# Patient Record
Sex: Male | Born: 1985 | Race: Black or African American | Hispanic: No | Marital: Single | Smoking: Never smoker
Health system: Southern US, Community
[De-identification: ages and names within clinical notes are randomized; demographics above are authoritative.]

---

## 2009-02-25 ENCOUNTER — Emergency Department (HOSPITAL_COMMUNITY): Admission: EM | Admit: 2009-02-25 | Discharge: 2009-02-25 | Payer: Self-pay | Admitting: Emergency Medicine

## 2009-03-31 ENCOUNTER — Emergency Department (HOSPITAL_COMMUNITY): Admission: EM | Admit: 2009-03-31 | Discharge: 2009-03-31 | Payer: Self-pay | Admitting: Emergency Medicine

## 2009-04-02 ENCOUNTER — Emergency Department (HOSPITAL_COMMUNITY): Admission: EM | Admit: 2009-04-02 | Discharge: 2009-04-02 | Payer: Self-pay | Admitting: Emergency Medicine

## 2009-04-03 ENCOUNTER — Emergency Department (HOSPITAL_COMMUNITY): Admission: EM | Admit: 2009-04-03 | Discharge: 2009-04-03 | Payer: Self-pay | Admitting: Emergency Medicine

## 2009-08-07 ENCOUNTER — Encounter: Admission: RE | Admit: 2009-08-07 | Discharge: 2009-08-07 | Payer: Self-pay | Admitting: Internal Medicine

## 2010-09-07 ENCOUNTER — Encounter: Payer: Self-pay | Admitting: Internal Medicine

## 2010-11-21 LAB — GRAM STAIN

## 2010-11-21 LAB — DIFFERENTIAL
Basophils Absolute: 0 10*3/uL (ref 0.0–0.1)
Basophils Relative: 0 % (ref 0–1)
Eosinophils Relative: 0 % (ref 0–5)
Lymphocytes Relative: 12 % (ref 12–46)
Lymphocytes Relative: 5 % — ABNORMAL LOW (ref 12–46)
Lymphs Abs: 0.6 10*3/uL — ABNORMAL LOW (ref 0.7–4.0)
Monocytes Absolute: 0.4 10*3/uL (ref 0.1–1.0)
Neutro Abs: 4.5 10*3/uL (ref 1.7–7.7)
Neutrophils Relative %: 80 % — ABNORMAL HIGH (ref 43–77)

## 2010-11-21 LAB — BASIC METABOLIC PANEL
BUN: 11 mg/dL (ref 6–23)
CO2: 22 mEq/L (ref 19–32)
Calcium: 8.9 mg/dL (ref 8.4–10.5)
Chloride: 104 mEq/L (ref 96–112)
Creatinine, Ser: 0.96 mg/dL (ref 0.4–1.5)

## 2010-11-21 LAB — CSF CULTURE W GRAM STAIN: Culture: NO GROWTH

## 2010-11-21 LAB — CSF CELL COUNT WITH DIFFERENTIAL
RBC Count, CSF: 1 /mm3 — ABNORMAL HIGH
WBC, CSF: 0 /mm3 (ref 0–5)

## 2010-11-21 LAB — URINALYSIS, ROUTINE W REFLEX MICROSCOPIC
Bilirubin Urine: NEGATIVE
Ketones, ur: 15 mg/dL — AB
Nitrite: NEGATIVE
Protein, ur: NEGATIVE mg/dL
pH: 7 (ref 5.0–8.0)

## 2010-11-21 LAB — POCT I-STAT, CHEM 8
BUN: 14 mg/dL (ref 6–23)
Chloride: 106 mEq/L (ref 96–112)
Glucose, Bld: 95 mg/dL (ref 70–99)
HCT: 48 % (ref 39.0–52.0)
Potassium: 4.2 mEq/L (ref 3.5–5.1)

## 2010-11-21 LAB — CBC
HCT: 47.4 % (ref 39.0–52.0)
Hemoglobin: 15.3 g/dL (ref 13.0–17.0)
MCHC: 33.6 g/dL (ref 30.0–36.0)
Platelets: 169 10*3/uL (ref 150–400)
RDW: 13.6 % (ref 11.5–15.5)
WBC: 4.9 10*3/uL (ref 4.0–10.5)

## 2010-11-21 LAB — POCT CARDIAC MARKERS: Troponin i, poc: <0.05 ng/mL (ref 0.00–0.09)

## 2010-11-21 LAB — GLUCOSE, CSF: Glucose, CSF: 63 mg/dL (ref 43–76)

## 2010-11-21 LAB — PROTEIN, CSF: Total  Protein, CSF: 23 mg/dL (ref 15–45)

## 2010-11-22 LAB — DIFFERENTIAL
Basophils Relative: 0 % (ref 0–1)
Eosinophils Absolute: 0 10*3/uL (ref 0.0–0.7)
Eosinophils Relative: 1 % (ref 0–5)
Lymphs Abs: 1.3 10*3/uL (ref 0.7–4.0)
Monocytes Relative: 11 % (ref 3–12)
Neutrophils Relative %: 66 % (ref 43–77)

## 2010-11-22 LAB — URINALYSIS, ROUTINE W REFLEX MICROSCOPIC
Bilirubin Urine: NEGATIVE
Glucose, UA: NEGATIVE mg/dL
Ketones, ur: NEGATIVE mg/dL
Protein, ur: NEGATIVE mg/dL
pH: 8 (ref 5.0–8.0)

## 2010-11-22 LAB — CBC
HCT: 44.6 % (ref 39.0–52.0)
MCHC: 32.6 g/dL (ref 30.0–36.0)
MCV: 82.1 fL (ref 78.0–100.0)
Platelets: 180 10*3/uL (ref 150–400)
RDW: 13.9 % (ref 11.5–15.5)

## 2010-11-22 LAB — COMPREHENSIVE METABOLIC PANEL
ALT: 18 U/L (ref 0–53)
Alkaline Phosphatase: 86 U/L (ref 39–117)
CO2: 27 mEq/L (ref 19–32)
Total Bilirubin: 0.9 mg/dL (ref 0.3–1.2)
Total Protein: 6.6 g/dL (ref 6.0–8.3)

## 2010-11-22 LAB — LIPASE, BLOOD: Lipase: 28 U/L (ref 11–59)

## 2015-08-29 ENCOUNTER — Emergency Department (HOSPITAL_COMMUNITY)
Admission: EM | Admit: 2015-08-29 | Discharge: 2015-08-29 | Disposition: A | Discharge status: Home / Self Care | Payer: No Typology Code available for payment source | Attending: Emergency Medicine | Admitting: Emergency Medicine

## 2015-08-29 ENCOUNTER — Encounter (HOSPITAL_COMMUNITY): Payer: Self-pay | Admitting: *Deleted

## 2015-08-29 ENCOUNTER — Emergency Department (HOSPITAL_COMMUNITY): Payer: No Typology Code available for payment source

## 2015-08-29 DIAGNOSIS — N201 Calculus of ureter: Secondary | ICD-10-CM | POA: Diagnosis not present

## 2015-08-29 DIAGNOSIS — R109 Unspecified abdominal pain: Secondary | ICD-10-CM | POA: Diagnosis present

## 2015-08-29 LAB — URINALYSIS, ROUTINE W REFLEX MICROSCOPIC
Bilirubin Urine: NEGATIVE
Glucose, UA: NEGATIVE mg/dL
Ketones, ur: NEGATIVE mg/dL
Leukocytes, UA: NEGATIVE
Nitrite: NEGATIVE
Protein, ur: NEGATIVE mg/dL
SPECIFIC GRAVITY, URINE: 1.011 (ref 1.005–1.030)
pH: 7.5 (ref 5.0–8.0)

## 2015-08-29 LAB — COMPREHENSIVE METABOLIC PANEL
ALBUMIN: 4.1 g/dL (ref 3.5–5.0)
ALT: 27 U/L (ref 17–63)
AST: 29 U/L (ref 15–41)
Alkaline Phosphatase: 69 U/L (ref 38–126)
Anion gap: 11 (ref 5–15)
BUN: 11 mg/dL (ref 6–20)
CHLORIDE: 108 mmol/L (ref 101–111)
CO2: 23 mmol/L (ref 22–32)
Calcium: 9 mg/dL (ref 8.9–10.3)
Creatinine, Ser: 1.2 mg/dL (ref 0.61–1.24)
GFR calc Af Amer: >60 mL/min (ref >60)
GLUCOSE: 147 mg/dL — AB (ref 65–99)
POTASSIUM: 3.5 mmol/L (ref 3.5–5.1)
Sodium: 142 mmol/L (ref 135–145)
Total Bilirubin: 1.8 mg/dL — ABNORMAL HIGH (ref 0.3–1.2)
Total Protein: 7.6 g/dL (ref 6.5–8.1)

## 2015-08-29 LAB — CBC WITH DIFFERENTIAL/PLATELET
Basophils Absolute: 0 10*3/uL (ref 0.0–0.1)
Basophils Relative: 0 %
EOS PCT: 0 %
Eosinophils Absolute: 0 10*3/uL (ref 0.0–0.7)
HCT: 42 % (ref 39.0–52.0)
Hemoglobin: 13.8 g/dL (ref 13.0–17.0)
LYMPHS ABS: 1 10*3/uL (ref 0.7–4.0)
LYMPHS PCT: 14 %
MCH: 26 pg (ref 26.0–34.0)
MCHC: 32.9 g/dL (ref 30.0–36.0)
MCV: 79.1 fL (ref 78.0–100.0)
MONOS PCT: 7 %
Monocytes Absolute: 0.5 10*3/uL (ref 0.1–1.0)
NEUTROS PCT: 79 %
Neutro Abs: 5.9 10*3/uL (ref 1.7–7.7)
PLATELETS: 207 10*3/uL (ref 150–400)
RBC: 5.31 MIL/uL (ref 4.22–5.81)
RDW: 13.1 % (ref 11.5–15.5)
WBC: 7.5 10*3/uL (ref 4.0–10.5)

## 2015-08-29 LAB — URINE MICROSCOPIC-ADD ON
BACTERIA UA: NONE SEEN
SQUAMOUS EPITHELIAL / LPF: NONE SEEN
WBC, UA: NONE SEEN WBC/hpf (ref 0–5)

## 2015-08-29 LAB — LIPASE, BLOOD: LIPASE: 25 U/L (ref 11–51)

## 2015-08-29 MED ORDER — IBUPROFEN 600 MG PO TABS: 600.0000 mg | ORAL_TABLET | Freq: Three times a day (TID) | ORAL | Status: DC | PRN

## 2015-08-29 MED ORDER — OXYCODONE-ACETAMINOPHEN 5-325 MG PO TABS: 1.0000 | ORAL_TABLET | ORAL | Status: DC | PRN

## 2015-08-29 MED ORDER — TAMSULOSIN HCL 0.4 MG PO CAPS: 0.4000 mg | ORAL_CAPSULE | Freq: Every day | ORAL | Status: DC

## 2015-08-29 MED ORDER — ONDANSETRON HCL 4 MG/2ML IJ SOLN
4.0000 mg | Freq: Once | INTRAMUSCULAR | Status: AC
  Administered 2015-08-29: 4 mg via INTRAVENOUS
  Filled 2015-08-29: qty 2

## 2015-08-29 MED ORDER — ONDANSETRON 4 MG PO TBDP: ORAL_TABLET | ORAL | Status: DC

## 2015-08-29 MED ORDER — SODIUM CHLORIDE 0.9 % IV BOLUS (SEPSIS)
1000.0000 mL | Freq: Once | INTRAVENOUS | Status: AC
  Administered 2015-08-29: 1000 mL via INTRAVENOUS

## 2015-08-29 MED ORDER — MORPHINE SULFATE (PF) 4 MG/ML IV SOLN
4.0000 mg | Freq: Once | INTRAVENOUS | Status: AC
  Administered 2015-08-29: 4 mg via INTRAVENOUS
  Filled 2015-08-29: qty 1

## 2015-08-29 NOTE — ED Notes (Signed)
Bed: [id number] Expected date:  Expected time:  Means of arrival:  Comments: EMS- 29yo M, abdominal pain

## 2015-08-29 NOTE — ED Provider Notes (Signed)
CSN: [id number]     Arrival date & time 08/29/15  4098 History   First MD Initiated Contact with Patient 08/29/15 1010     Chief Complaint  Patient presents with  . Abdominal Pain  . Nausea  . Emesis     (Consider location/radiation/quality/duration/timing/severity/associated sxs/prior Treatment) HPI  30 year old male presents with acute abdominal pain and vomiting. Patient states that immediately after eating to oranges morning while he is getting ready for school he developed left-sided abdominal pain and vomited. Given Zofran and fentanyl by EMS. Patient denies any diarrhea. No fevers but has felt chills since this started. Denies any hematemesis. No dysuria or hematuria. Patient rates his pain as severe. No similar symptoms in the past. No back pain.  History reviewed. No pertinent past medical history. History reviewed. No pertinent past surgical history. History reviewed. No pertinent family history. Social History  Substance Use Topics  . Smoking status: Never Smoker   . Smokeless tobacco: None  . Alcohol Use: No    Review of Systems  Constitutional: Positive for chills. Negative for fever.  Gastrointestinal: Positive for nausea, vomiting and abdominal pain. Negative for diarrhea.  Genitourinary: Negative for dysuria and hematuria.  Musculoskeletal: Negative for back pain.  All other systems reviewed and are negative.     Allergies  Review of patient's allergies indicates no known allergies.  Home Medications   Prior to Admission medications   Not on File   BP 131/103 mmHg  Pulse 66  Temp(Src) 97.8 F (36.6 C) (Axillary)  Resp 20  SpO2 100% Physical Exam  Constitutional: He is oriented to person, place, and time. He appears well-developed and well-nourished.  HENT:  Head: Normocephalic and atraumatic.  Right Ear: External ear normal.  Left Ear: External ear normal.  Nose: Nose normal.  Eyes: Right eye exhibits no discharge. Left eye exhibits no  discharge.  Neck: Neck supple.  Cardiovascular: Normal rate, regular rhythm, normal heart sounds and intact distal pulses.   Pulmonary/Chest: Effort normal and breath sounds normal.  Abdominal: Soft. There is tenderness (mild) in the left upper quadrant and left lower quadrant. There is no CVA tenderness.  Musculoskeletal: He exhibits no edema.  Neurological: He is alert and oriented to person, place, and time.  Skin: Skin is warm and dry.  Nursing note and vitals reviewed.   ED Course  Procedures (including critical care time) Labs Review Labs Reviewed  COMPREHENSIVE METABOLIC PANEL - Abnormal; Notable for the following:    Glucose, Bld 147 (*)    Total Bilirubin 1.8 (*)    All other components within normal limits  URINALYSIS, ROUTINE W REFLEX MICROSCOPIC (NOT AT [facility]) - Abnormal; Notable for the following:    Hgb urine dipstick LARGE (*)    All other components within normal limits  CBC WITH DIFFERENTIAL/PLATELET  LIPASE, BLOOD  URINE MICROSCOPIC-ADD ON    Imaging Review Ct Renal Stone Study  08/29/2015  CLINICAL DATA:  Left side pain starting this morning EXAM: CT ABDOMEN AND PELVIS WITHOUT CONTRAST TECHNIQUE: Multidetector CT imaging of the abdomen and pelvis was performed following the standard protocol without IV contrast. COMPARISON:  None. FINDINGS: Lung bases are unremarkable. Sagittal images of the spine are unremarkable. Unenhanced liver shows no biliary ductal dilatation. No calcified gallstones are noted within gallbladder. There is no aortic aneurysm. Unenhanced pancreas, spleen and adrenal glands are unremarkable. No nephrolithiasis. Mild left hydronephrosis and left hydroureter. No small bowel obstruction.  No ascites or free air.  No adenopathy. Normal appendix is  noted in axial image 42. No pericecal inflammation. The terminal ileum is unremarkable. Axial image 65 there is 3.5 mm calcified obstructive calculus in distal left ureter about 6.5 mm from left UVJ. No  calcified calculi are noted within urinary bladder. Prostate gland and seminal vesicles are unremarkable. No inguinal adenopathy. IMPRESSION: 1. There is mild left hydronephrosis and left hydroureter. No nephrolithiasis. 2. Axial image 42 there is 3.5 mm calcified obstructive calculus in distal left ureter about 6.5 mm from left UVJ. 3. No calcified calculi are noted within urinary bladder. 4. Normal appendix.  No pericecal inflammation. 5. No small bowel obstruction. Electronically Signed   By: Natasha MeadLiviu  Pop M.D.   On: 08/29/2015 11:23   I have personally reviewed and evaluated these images and lab results as part of my medical decision-making.   EKG Interpretation None      MDM   Final diagnoses:  Left ureteral stone    CT shows a 3.5 mm left ureteral calculus that is likely the cause of his acute pain and vomiting. Now has minimal pain and no further nausea. No UTI. Feels well enough to go home, encouraged ibuprofen and then percocet as needed along with short course of flomax. Advised of return precautions, recommend f/u with urology.    Pricilla LovelessScott Neesha Langton, MD 08/29/15 1600

## 2015-08-29 NOTE — ED Notes (Signed)
Pt woke feeling fine. Ate ate two oranges this morning and immediately started feeling 10/10 abdominal pain with vomiting. Patient received 4 mg IV zofran and 15 mcg IV fentanyl en route by EMS. Patient arrived with 18 gauge PIV and NS infusing. Patient currently writhing around in bed in pain. Patient vomited approx 200 CC clear fluid in emesis bag.

## 2015-08-29 NOTE — ED Notes (Signed)
RN to draw from IV access for labs

## 2017-02-05 IMAGING — CT CT RENAL STONE PROTOCOL
2 of 3 series · 16 of 29 positions shown, 18 images · non-contrast
Comparison: None.

CLINICAL DATA: Left side pain starting this morning

EXAM:
CT ABDOMEN AND PELVIS WITHOUT CONTRAST
TECHNIQUE: Multidetector CT imaging of the abdomen and pelvis was performed
following the standard protocol without IV contrast.

[Series 3: coronal · coronal · 0.75mm/px · 3 of 67 slices shown]
[im 23/67  soft-tissue]
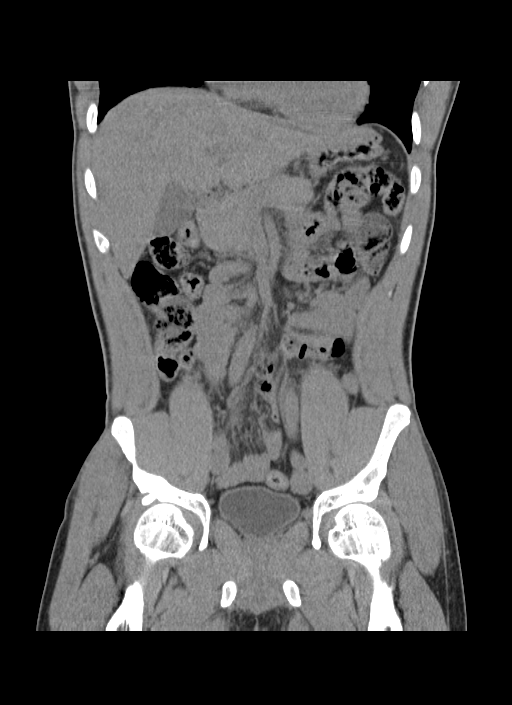
[im 30/67  soft-tissue]
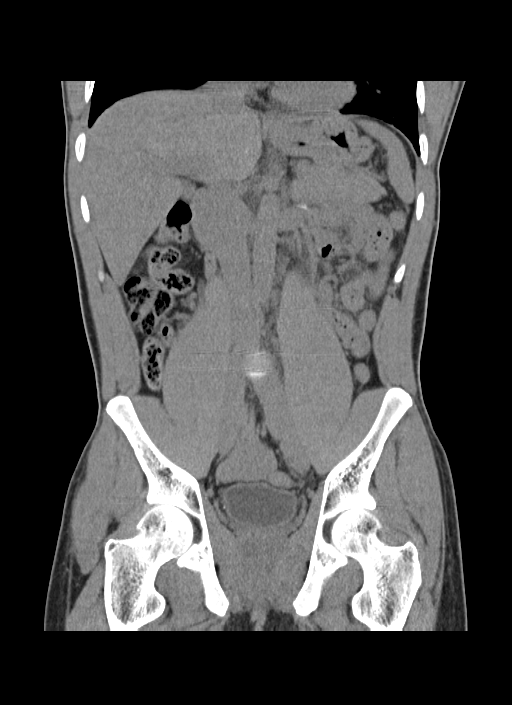
[im 37/67  soft-tissue]
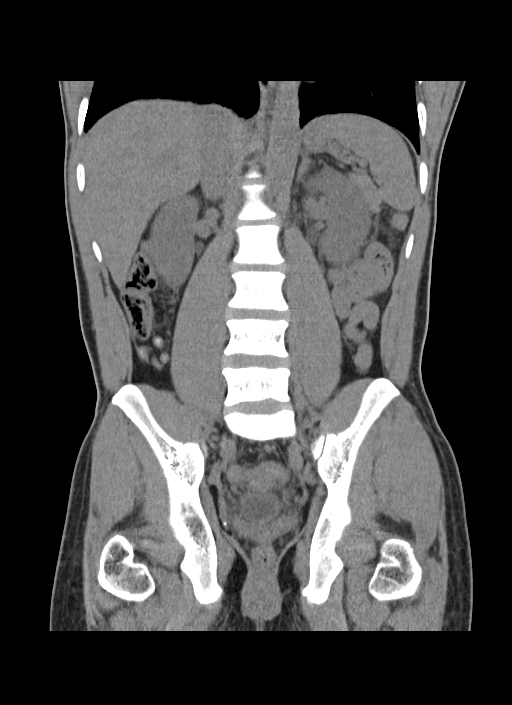

[Series 6: lung · axial · 0.65mm/px · z∈[+1326,+1386]mm · 13 of 15 slices shown, 15 images]
[im 2/15  soft-tissue]
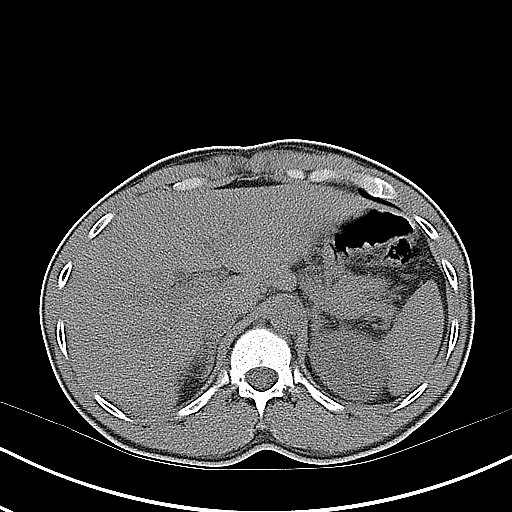
[im 2/15  bone]
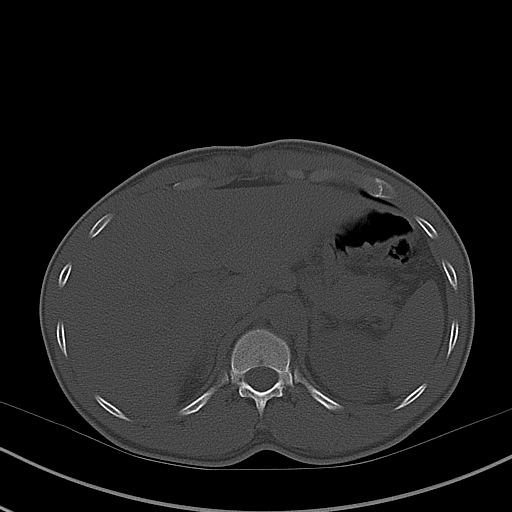
[im 3/15  soft-tissue]
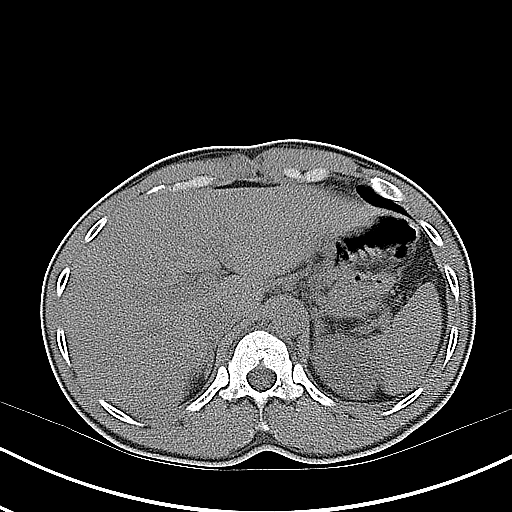
[im 4/15  soft-tissue]
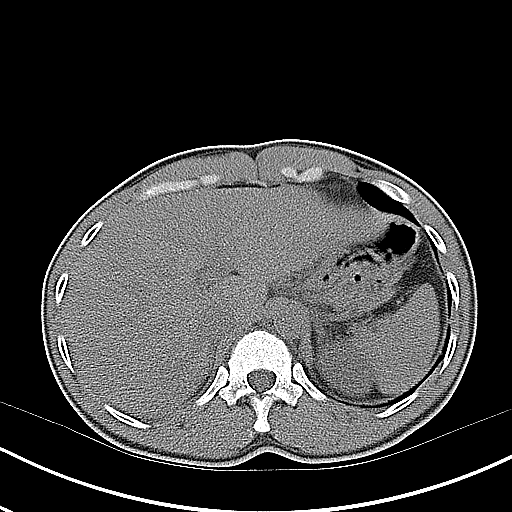
[im 5/15  soft-tissue]
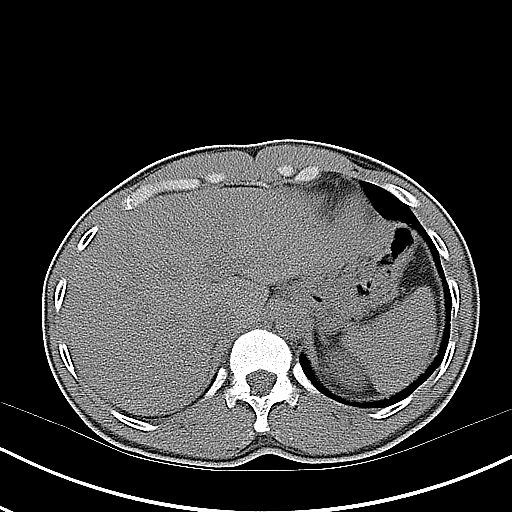
[im 6/15  soft-tissue]
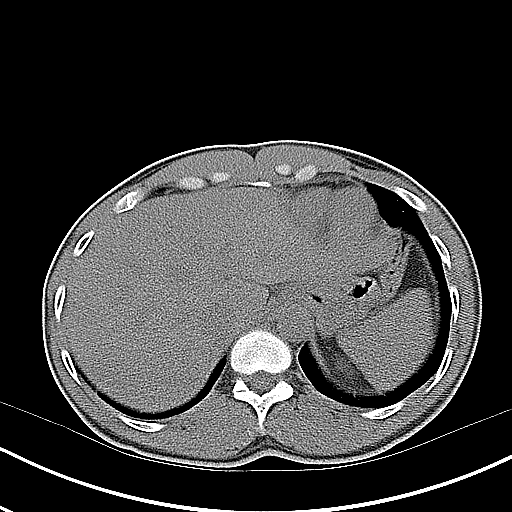
[im 7/15  soft-tissue]
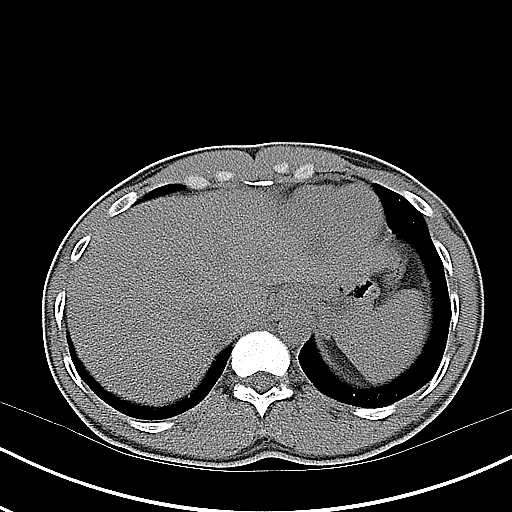
[im 8/15  soft-tissue]
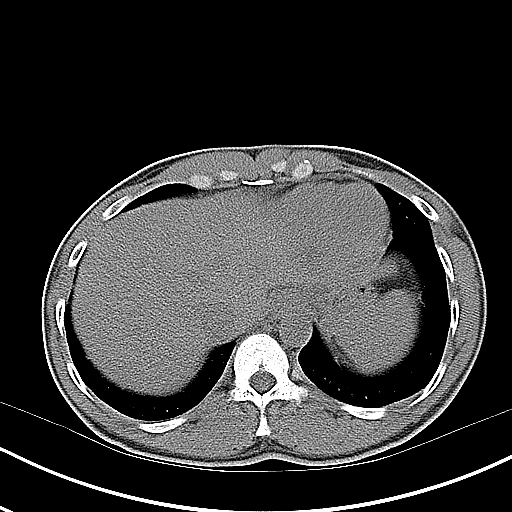
[im 9/15  soft-tissue]
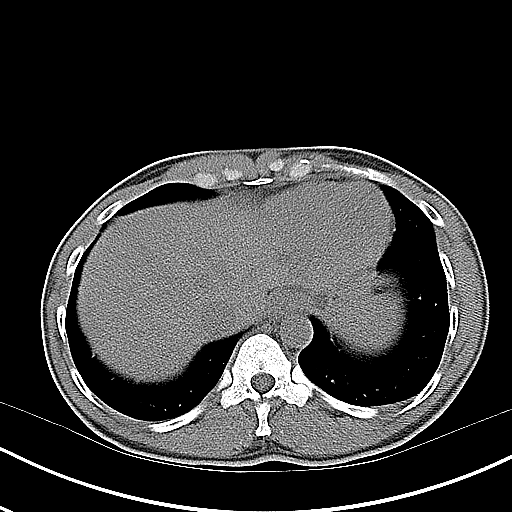
[im 10/15  soft-tissue]
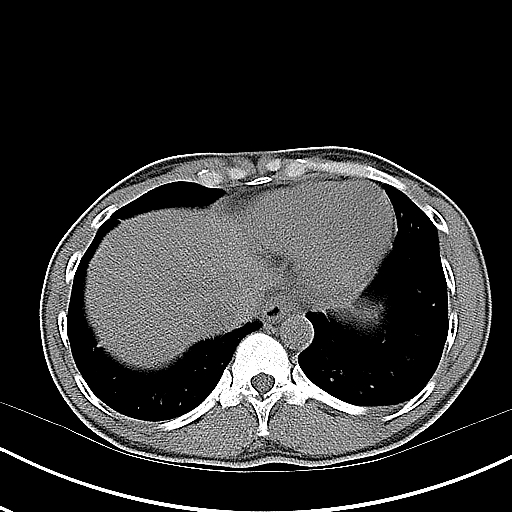
[im 10/15  bone]
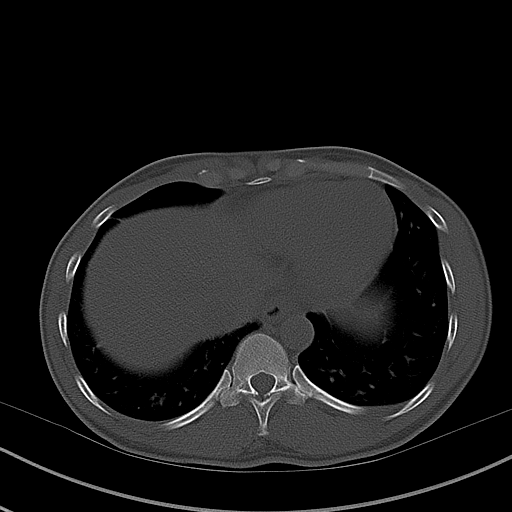
[im 11/15  soft-tissue]
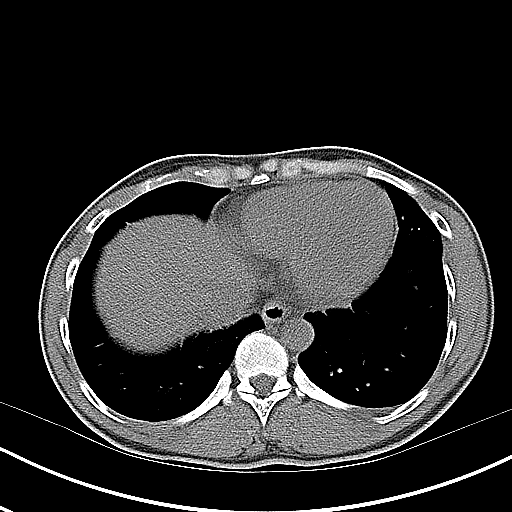
[im 12/15  soft-tissue]
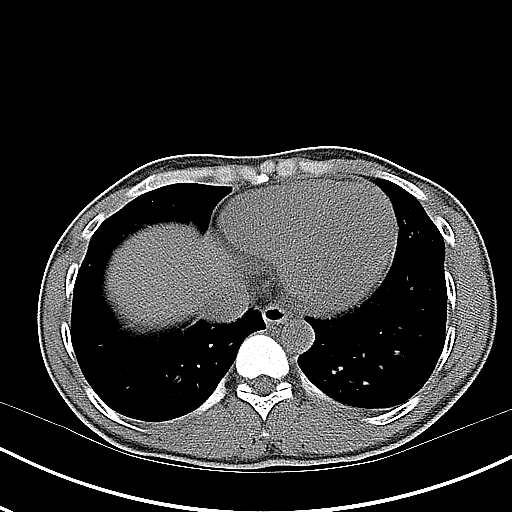
[im 13/15  soft-tissue]
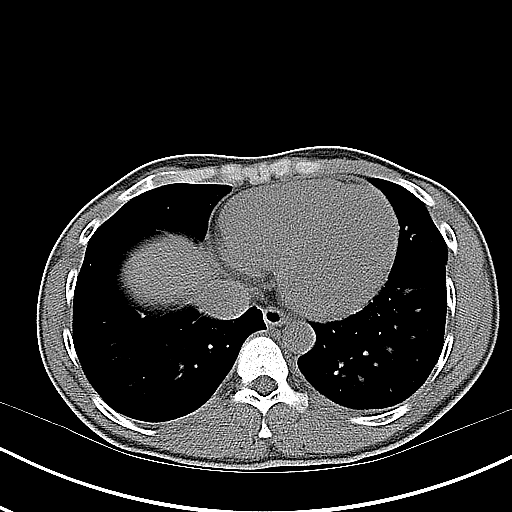
[im 14/15  soft-tissue]
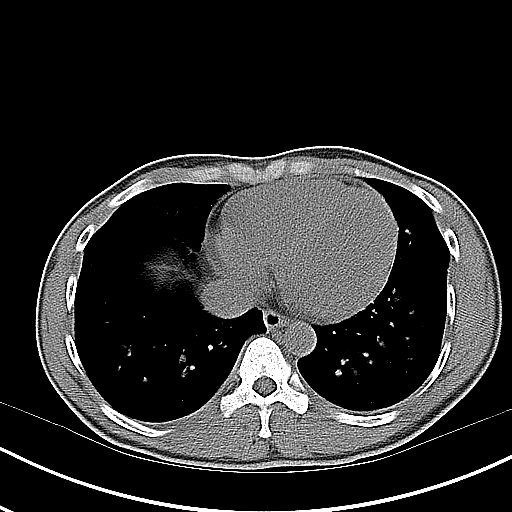

[16 of 29 positions shown; findings below may reference images not displayed]

FINDINGS: Lung bases are unremarkable. Sagittal images of the spine are
unremarkable.

Unenhanced liver shows no biliary ductal dilatation. No calcified
gallstones are noted within gallbladder. There is no aortic
aneurysm. Unenhanced pancreas, spleen and adrenal glands are
unremarkable. No nephrolithiasis. Mild left hydronephrosis and left
hydroureter.

No small bowel obstruction.  No ascites or free air.  No adenopathy.

Normal appendix is noted in axial image 42. No pericecal
inflammation. The terminal ileum is unremarkable.

Axial image 65 there is 3.5 mm calcified obstructive calculus in
distal left ureter about 6.5 mm from left UVJ. No calcified calculi
are noted within urinary bladder. Prostate gland and seminal
vesicles are unremarkable. No inguinal adenopathy.
IMPRESSION: 1. There is mild left hydronephrosis and left hydroureter. No
nephrolithiasis.
2. Axial image 42 there is 3.5 mm calcified obstructive calculus in
distal left ureter about 6.5 mm from left UVJ.
3. No calcified calculi are noted within urinary bladder.
4. Normal appendix.  No pericecal inflammation.
5. No small bowel obstruction.

## 2019-11-21 ENCOUNTER — Other Ambulatory Visit: Payer: Self-pay

## 2019-11-21 ENCOUNTER — Ambulatory Visit (INDEPENDENT_AMBULATORY_CARE_PROVIDER_SITE_OTHER): Payer: 59 | Admitting: Primary Care

## 2019-11-21 ENCOUNTER — Encounter (INDEPENDENT_AMBULATORY_CARE_PROVIDER_SITE_OTHER): Payer: Self-pay | Admitting: Primary Care

## 2019-11-21 VITALS — BP 146/83 | HR 84 | Temp 98.6°F | Ht 68.0 in | Wt 151.4 lb

## 2019-11-21 DIAGNOSIS — I10 Essential (primary) hypertension: Secondary | ICD-10-CM | POA: Diagnosis not present

## 2019-11-21 DIAGNOSIS — Z7689 Persons encountering health services in other specified circumstances: Secondary | ICD-10-CM | POA: Diagnosis not present

## 2019-11-21 NOTE — Progress Notes (Signed)
New Patient Office Visit  Subjective:  Patient ID: Spencer Olson, male    DOB: 07/30/1986  Age: 34 y.o. MRN: [id number]  CC:  Chief Complaint  Patient presents with  . New Patient (Initial Visit)    HPI Mr Spencer Olson is a 34 year old African male from [place]. He presents today to establish care he has a known history of hypertension but doe not take medication regularly. Elevated blood pressure today discussed risk factors and medication compliance. History reviewed. No pertinent past medical history.  History reviewed. No pertinent surgical history.  History reviewed. No pertinent family history.  Social History   Socioeconomic History  . Marital status: Single    Spouse name: Not on file  . Number of children: Not on file  . Years of education: Not on file  . Highest education level: Not on file  Occupational History  . Not on file  Tobacco Use  . Smoking status: Never Smoker  . Smokeless tobacco: Never Used  Substance and Sexual Activity  . Alcohol use: No  . Drug use: No  . Sexual activity: Not on file  Other Topics Concern  . Not on file  Social History Narrative  . Not on file   Social Determinants of Health   Financial Resource Strain:   . Difficulty of Paying Living Expenses:   Food Insecurity:   . Worried About Charity fundraiser in the Last Year:   . Arboriculturist in the Last Year:   Transportation Needs:   . Film/video editor (Medical):   Marland Kitchen Lack of Transportation (Non-Medical):   Physical Activity:   . Days of Exercise per Week:   . Minutes of Exercise per Session:   Stress:   . Feeling of Stress :   Social Connections:   . Frequency of Communication with Friends and Family:   . Frequency of Social Gatherings with Friends and Family:   . Attends Religious Services:   . Active Member of Clubs or Organizations:   . Attends Archivist Meetings:   Marland Kitchen Marital Status:   Intimate Partner Violence:   . Fear of Current or  Ex-Partner:   . Emotionally Abused:   Marland Kitchen Physically Abused:   . Sexually Abused:     ROS Review of Systems  All other systems reviewed and are negative.   Objective:   Today's Vitals: BP (!) 146/83 (BP Location: Right Arm, Patient Position: Sitting, Cuff Size: Normal)   Pulse 84   Temp 98.6 F (37 C) (Temporal)   Ht 5\' 8"  (1.727 m)   Wt 151 lb 6.4 oz (68.7 kg)   SpO2 97%   BMI 23.02 kg/m   Physical Exam Vitals reviewed.  Constitutional:      Appearance: Normal appearance.  HENT:     Right Ear: Tympanic membrane normal.     Left Ear: Tympanic membrane normal.     Nose: Nose normal.  Eyes:     Extraocular Movements: Extraocular movements intact.     Pupils: Pupils are equal, round, and reactive to light.  Cardiovascular:     Rate and Rhythm: Normal rate and regular rhythm.     Pulses: Normal pulses.     Heart sounds: Normal heart sounds.  Pulmonary:     Effort: Pulmonary effort is normal.     Breath sounds: Normal breath sounds.  Abdominal:     General: Abdomen is flat. Bowel sounds are normal.  Musculoskeletal:  General: Normal range of motion.     Cervical back: Normal range of motion.  Skin:    General: Skin is warm and dry.  Neurological:     General: No focal deficit present.     Mental Status: He is alert and oriented to person, place, and time.  Psychiatric:        Mood and Affect: Mood normal.        Behavior: Behavior normal.        Thought Content: Thought content normal.        Judgment: Judgment normal.     Assessment & Plan:   Problem List Items Addressed This Visit    None    Visit Diagnoses    Encounter to establish care    -  Primary   Essential hypertension       Relevant Medications   ASPIRIN LOW DOSE 81 MG EC tablet   carvedilol (COREG) 3.125 MG tablet   hydrochlorothiazide (HYDRODIURIL) 12.5 MG tablet   nitroGLYCERIN (NITROSTAT) 0.4 MG SL tablet      Outpatient Encounter Medications as of 11/21/2019  Medication Sig  .  hydrochlorothiazide (HYDRODIURIL) 12.5 MG tablet Take 12.5 mg by mouth daily.  . ASPIRIN LOW DOSE 81 MG EC tablet Take 81 mg by mouth daily.  . carvedilol (COREG) 3.125 MG tablet Take 3.125 mg by mouth 2 (two) times daily.  . nitroGLYCERIN (NITROSTAT) 0.4 MG SL tablet SMARTSIG:1 Tablet(s) Sublingual PRN  . [DISCONTINUED] acetaminophen (TYLENOL) 500 MG tablet Take 1,000 mg by mouth every 6 (six) hours as needed for moderate pain or headache.  . [DISCONTINUED] ibuprofen (ADVIL,MOTRIN) 600 MG tablet Take 1 tablet (600 mg total) by mouth every 8 (eight) hours as needed.  . [DISCONTINUED] Multiple Vitamin (MULTIVITAMIN WITH MINERALS) TABS tablet Take 1 tablet by mouth daily.  . [DISCONTINUED] Nutritional Supplements (COLD AND FLU PO) Take 2 tablets by mouth 3 (three) times daily as needed (cold symptoms). Equate brand  . [DISCONTINUED] ondansetron (ZOFRAN ODT) 4 MG disintegrating tablet 4mg  ODT q4 hours prn nausea/vomit  . [DISCONTINUED] oxyCODONE-acetaminophen (PERCOCET) 5-325 MG tablet Take 1-2 tablets by mouth every 4 (four) hours as needed for severe pain.  . [DISCONTINUED] tamsulosin (FLOMAX) 0.4 MG CAPS capsule Take 1 capsule (0.4 mg total) by mouth daily.   No facility-administered encounter medications on file as of 11/21/2019.  Lonn was seen today for new patient (initial visit).  Diagnoses and all orders for this visit:  Encounter to establish care Vita Erm, NP-C will be your  (PCP) she is mastered prepared . She is skilled to diagnosed and treat illness. Also able to answer health concern as well as continuing care of varied medical conditions, not limited by cause, organ system, or diagnosis.   Essential hypertension Counseled on blood pressure goal of less than 130/80, low-sodium, DASH diet, medication compliance, 150 minutes of moderate intensity exercise per week. Discussed medication compliance, adverse effects.    Follow-up: Return in about 6 weeks (around 01/02/2020) for  [facility] tele follow up .   01/04/2020, NP

## 2019-11-21 NOTE — Patient Instructions (Signed)

## 2019-12-05 ENCOUNTER — Encounter (INDEPENDENT_AMBULATORY_CARE_PROVIDER_SITE_OTHER): Payer: Self-pay | Admitting: Primary Care

## 2019-12-12 ENCOUNTER — Ambulatory Visit (INDEPENDENT_AMBULATORY_CARE_PROVIDER_SITE_OTHER): Payer: No Typology Code available for payment source | Admitting: Primary Care

## 2019-12-13 ENCOUNTER — Ambulatory Visit (INDEPENDENT_AMBULATORY_CARE_PROVIDER_SITE_OTHER): Payer: No Typology Code available for payment source | Admitting: Primary Care

## 2019-12-25 ENCOUNTER — Telehealth (INDEPENDENT_AMBULATORY_CARE_PROVIDER_SITE_OTHER): Payer: No Typology Code available for payment source | Admitting: Primary Care

## 2022-06-21 ENCOUNTER — Ambulatory Visit (HOSPITAL_BASED_OUTPATIENT_CLINIC_OR_DEPARTMENT_OTHER): Payer: No Typology Code available for payment source | Admitting: Family Medicine

## 2022-07-20 ENCOUNTER — Ambulatory Visit (HOSPITAL_BASED_OUTPATIENT_CLINIC_OR_DEPARTMENT_OTHER): Payer: No Typology Code available for payment source | Admitting: Family Medicine

## 2024-07-02 ENCOUNTER — Emergency Department (HOSPITAL_COMMUNITY)

## 2024-07-02 ENCOUNTER — Emergency Department (HOSPITAL_COMMUNITY)
Admission: EM | Admit: 2024-07-02 | Discharge: 2024-07-02 | Disposition: A | Discharge status: Home / Self Care | Attending: Emergency Medicine | Admitting: Emergency Medicine

## 2024-07-02 DIAGNOSIS — N202 Calculus of kidney with calculus of ureter: Secondary | ICD-10-CM | POA: Diagnosis not present

## 2024-07-02 DIAGNOSIS — N134 Hydroureter: Secondary | ICD-10-CM | POA: Insufficient documentation

## 2024-07-02 DIAGNOSIS — R1031 Right lower quadrant pain: Secondary | ICD-10-CM | POA: Diagnosis present

## 2024-07-02 DIAGNOSIS — N200 Calculus of kidney: Secondary | ICD-10-CM

## 2024-07-02 DIAGNOSIS — Z7982 Long term (current) use of aspirin: Secondary | ICD-10-CM | POA: Diagnosis not present

## 2024-07-02 LAB — LIPASE, BLOOD: Lipase: 18 U/L (ref 11–51)

## 2024-07-02 LAB — URINALYSIS, ROUTINE W REFLEX MICROSCOPIC
Bacteria, UA: NONE SEEN
Bilirubin Urine: NEGATIVE
Glucose, UA: NEGATIVE mg/dL
Ketones, ur: 5 mg/dL — AB
Leukocytes,Ua: NEGATIVE
Nitrite: NEGATIVE
Protein, ur: NEGATIVE mg/dL
Specific Gravity, Urine: 1.016 (ref 1.005–1.030)
pH: 7 (ref 5.0–8.0)

## 2024-07-02 LAB — COMPREHENSIVE METABOLIC PANEL WITH GFR
ALT: 31 U/L (ref 0–44)
AST: 27 U/L (ref 15–41)
Albumin: 4.1 g/dL (ref 3.5–5.0)
Alkaline Phosphatase: 75 U/L (ref 38–126)
Anion gap: 12 (ref 5–15)
BUN: 12 mg/dL (ref 6–20)
CO2: 22 mmol/L (ref 22–32)
Calcium: 8.8 mg/dL — ABNORMAL LOW (ref 8.9–10.3)
Chloride: 105 mmol/L (ref 98–111)
Creatinine, Ser: 1.22 mg/dL (ref 0.61–1.24)
GFR, Estimated: >60 mL/min (ref >60)
Glucose, Bld: 138 mg/dL — ABNORMAL HIGH (ref 70–99)
Potassium: 4 mmol/L (ref 3.5–5.1)
Sodium: 138 mmol/L (ref 135–145)
Total Bilirubin: 1 mg/dL (ref 0.0–1.2)
Total Protein: 7.4 g/dL (ref 6.5–8.1)

## 2024-07-02 LAB — CBC
HCT: 40.1 % (ref 39.0–52.0)
Hemoglobin: 13.1 g/dL (ref 13.0–17.0)
MCH: 26.1 pg (ref 26.0–34.0)
MCHC: 32.7 g/dL (ref 30.0–36.0)
MCV: 79.9 fL — ABNORMAL LOW (ref 80.0–100.0)
Platelets: 204 K/uL (ref 150–400)
RBC: 5.02 MIL/uL (ref 4.22–5.81)
RDW: 14 % (ref 11.5–15.5)
WBC: 7.9 K/uL (ref 4.0–10.5)
nRBC: 0 % (ref 0.0–0.2)

## 2024-07-02 MED ORDER — KETOROLAC TROMETHAMINE 15 MG/ML IJ SOLN
30.0000 mg | Freq: Once | INTRAMUSCULAR | Status: AC
  Administered 2024-07-02: 30 mg via INTRAVENOUS
  Filled 2024-07-02: qty 2

## 2024-07-02 MED ORDER — SODIUM CHLORIDE 0.9 % IV BOLUS
1000.0000 mL | Freq: Once | INTRAVENOUS | Status: AC
  Administered 2024-07-02: 1000 mL via INTRAVENOUS

## 2024-07-02 MED ORDER — HYDROMORPHONE HCL 1 MG/ML IJ SOLN
0.5000 mg | Freq: Once | INTRAMUSCULAR | Status: AC
  Administered 2024-07-02: 0.5 mg via INTRAVENOUS
  Filled 2024-07-02: qty 1

## 2024-07-02 MED ORDER — ONDANSETRON 4 MG PO TBDP: 4.0000 mg | ORAL_TABLET | Freq: Three times a day (TID) | ORAL | 0 refills | Status: AC | PRN

## 2024-07-02 MED ORDER — HYDROCODONE-ACETAMINOPHEN 5-325 MG PO TABS: 2.0000 | ORAL_TABLET | ORAL | 0 refills | Status: AC | PRN

## 2024-07-02 MED ORDER — ONDANSETRON HCL 4 MG/2ML IJ SOLN
4.0000 mg | Freq: Once | INTRAMUSCULAR | Status: AC
  Administered 2024-07-02: 4 mg via INTRAVENOUS
  Filled 2024-07-02: qty 2

## 2024-07-02 NOTE — ED Triage Notes (Signed)
 Pt arrives via [facility] from home c/o RLQ/flank pain that started this morning along with N/V. Pt reports hx of kidney stones and pain feels similar. Pt given 100 mcg fentanyl, 4 mg zofran , and 500 mL NS bolus enroute.

## 2024-07-02 NOTE — Medical Student Note (Incomplete)
   [facility]-EMERGENCY DEPT Provider Student Note For educational purposes for Medical, PA and NP students only and not part of the legal medical record.   CSN: [id number] Arrival date & time: 07/02/24  1150      History   Chief Complaint Chief Complaint  Patient presents with  . Abdominal Pain    HPI Spencer Olson is a 38 y.o. male presents to the ED with acute onset of abdominal pain that began this morning. Pt states that the belly pain was generalized but mostly around his belly button and gradually moved to his RLQ. Pain is associate with nausea and 4 episodes of non bloody non bilious vomit. Pt denies SOB, CP, dizziness, constipation, diarrhea, dysuria, hematuria, flank pain, radiation of pain to the groin. Pt has not eaten anything different recently, no recent illness or travel.  PE: uncomfortable, + Rovsing, + [place]burney,    Abdominal Pain   No past medical history on file.  There are no active problems to display for this patient.   No past surgical history on file.     Home Medications    Prior to Admission medications   Medication Sig Start Date End Date Taking? Authorizing Provider  ASPIRIN LOW DOSE 81 MG EC tablet Take 81 mg by mouth daily. 09/27/19   [provider]  carvedilol (COREG) 3.125 MG tablet Take 3.125 mg by mouth 2 (two) times daily. 10/28/19   [provider]  hydrochlorothiazide (HYDRODIURIL) 12.5 MG tablet Take 12.5 mg by mouth daily. 06/27/19   [provider]  nitroGLYCERIN (NITROSTAT) 0.4 MG SL tablet SMARTSIG:1 Tablet(s) Sublingual PRN 09/27/19   [provider]    Family History No family history on file.  Social History Social History   Tobacco Use  . Smoking status: Never  . Smokeless tobacco: Never  Substance Use Topics  . Alcohol use: No  . Drug use: No     Allergies   Patient has no known allergies.   Review of Systems Review of Systems  Gastrointestinal:  Positive for abdominal pain.      Physical Exam Updated Vital Signs BP (!) 152/97 (BP Location: Right Arm)   Pulse 71   Temp 97.6 F (36.4 C) (Oral)   Resp (!) 26   Ht 5' 8 (1.727 m)   Wt 70.3 kg   SpO2 100%   BMI 23.57 kg/m   Physical Exam   ED Treatments / Results  Labs (all labs ordered are listed, but only abnormal results are displayed) Labs Reviewed  CBC - Abnormal; Notable for the following components:      Result Value   MCV 79.9 (*)    All other components within normal limits  LIPASE, BLOOD  COMPREHENSIVE METABOLIC PANEL WITH GFR  URINALYSIS, ROUTINE W REFLEX MICROSCOPIC    EKG  Radiology No results found.  Procedures Procedures (including critical care time)  Medications Ordered in ED Medications - No data to display   Initial Impression / Assessment and Plan / ED Course  I have reviewed the triage vital signs and the nursing notes.  Pertinent labs & imaging results that were available during my care of the patient were reviewed by me and considered in my medical decision making (see chart for details).     ***  Final Clinical Impressions(s) / ED Diagnoses   Final diagnoses:  None    New Prescriptions New Prescriptions   No medications on file

## 2024-07-02 NOTE — ED Provider Notes (Signed)
  [place] EMERGENCY DEPARTMENT AT [facility] Provider Note   CSN: [id number] Arrival date & time: 07/02/24  1150     Patient presents with: Abdominal Pain   Spencer Olson is a 38 y.o. male.  {Add pertinent medical, surgical, social history, OB history to HPI:32947}  Abdominal Pain         Prior to Admission medications   Medication Sig Start Date End Date Taking? Authorizing Provider  ASPIRIN LOW DOSE 81 MG EC tablet Take 81 mg by mouth daily. 09/27/19   [provider]  carvedilol (COREG) 3.125 MG tablet Take 3.125 mg by mouth 2 (two) times daily. 10/28/19   [provider]  hydrochlorothiazide (HYDRODIURIL) 12.5 MG tablet Take 12.5 mg by mouth daily. 06/27/19   [provider]  nitroGLYCERIN (NITROSTAT) 0.4 MG SL tablet SMARTSIG:1 Tablet(s) Sublingual PRN 09/27/19   [provider]    Allergies: Patient has no known allergies.    Review of Systems  Gastrointestinal:  Positive for abdominal pain.    Updated Vital Signs BP (!) 152/97 (BP Location: Right Arm)   Pulse 71   Temp 97.6 F (36.4 C) (Oral)   Resp (!) 26   Ht 5' 8 (1.727 m)   Wt 70.3 kg   SpO2 100%   BMI 23.57 kg/m   Physical Exam  (all labs ordered are listed, but only abnormal results are displayed) Labs Reviewed  COMPREHENSIVE METABOLIC PANEL WITH GFR - Abnormal; Notable for the following components:      Result Value   Glucose, Bld 138 (*)    Calcium 8.8 (*)    All other components within normal limits  CBC - Abnormal; Notable for the following components:   MCV 79.9 (*)    All other components within normal limits  URINALYSIS, ROUTINE W REFLEX MICROSCOPIC - Abnormal; Notable for the following components:   Hgb urine dipstick SMALL (*)    Ketones, ur 5 (*)    All other components within normal limits  LIPASE, BLOOD    EKG: None  Radiology: No results found.  {Document cardiac monitor, telemetry assessment procedure when  appropriate:32947} Procedures   Medications Ordered in the ED  ondansetron  (ZOFRAN ) injection 4 mg (4 mg Intravenous Given 07/02/24 1257)  HYDROmorphone (DILAUDID) injection 0.5 mg (0.5 mg Intravenous Given 07/02/24 1257)  sodium chloride  0.9 % bolus 1,000 mL (1,000 mLs Intravenous New Bag/Given 07/02/24 1256)      {Click here for ABCD2, HEART and other calculators REFRESH Note before signing:1}                              Medical Decision Making Amount and/or Complexity of Data Reviewed Labs: ordered. Radiology: ordered.  Risk Prescription drug management.   ***  {Document critical care time when appropriate  Document review of labs and clinical decision tools ie CHADS2VASC2, etc  Document your independent review of radiology images and any outside records  Document your discussion with family members, caretakers and with consultants  Document social determinants of health affecting pt's care  Document your decision making why or why not admission, treatments were needed:32947:::1}   Final diagnoses:  None    ED Discharge Orders     None

## 2024-07-02 NOTE — Discharge Instructions (Addendum)
You had a 2mm kidney stone located in your ureter. Strain all urine with a strainer that was provided to you.  Make sure you drink plenty of water to maintain hydration.  Please use Tylenol or ibuprofen for pain.  You may use 600 mg ibuprofen every 6 hours or 1000 mg of Tylenol every 6 hours.  You may choose to alternate between the 2.  This would be most effective.  Not to exceed 4 g of Tylenol within 24 hours.  Not to exceed 3200 mg ibuprofen 24 hours.     I have prescribed or offered you other medicine for breakthrough pain. Notably there is a small amount of Tylenol--specifically 325 mg--in each dose of Percocet.  If you do take a dose of Percocet please take a 500 mg instead of the 1000 mg dose of Tylenol.     Follow up with [facility] I have given you the information for their office.  Generally kidney stones pass on their own given time in the focus of treatment is minimizing pain
